# Patient Record
Sex: Female | Born: 1995 | Race: White | Hispanic: No | Marital: Single | Smoking: Never smoker
Health system: Southern US, Community
[De-identification: ages and names within clinical notes are randomized; demographics above are authoritative.]

## PROBLEM LIST (undated history)

## (undated) DIAGNOSIS — J45909 Unspecified asthma, uncomplicated: Secondary | ICD-10-CM

## (undated) HISTORY — PX: TONSILLECTOMY: SUR1361

## (undated) HISTORY — DX: Unspecified asthma, uncomplicated: J45.909

## (undated) HISTORY — PX: ADENOIDECTOMY: SUR15

---
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
## 2017-03-17 DIAGNOSIS — Z111 Encounter for screening for respiratory tuberculosis: Secondary | ICD-10-CM | POA: Diagnosis not present

## 2017-06-05 DIAGNOSIS — H66005 Acute suppurative otitis media without spontaneous rupture of ear drum, recurrent, left ear: Secondary | ICD-10-CM | POA: Diagnosis not present

## 2017-06-05 DIAGNOSIS — Z6828 Body mass index (BMI) 28.0-28.9, adult: Secondary | ICD-10-CM | POA: Diagnosis not present

## 2017-06-05 DIAGNOSIS — B9689 Other specified bacterial agents as the cause of diseases classified elsewhere: Secondary | ICD-10-CM | POA: Diagnosis not present

## 2017-06-05 DIAGNOSIS — J019 Acute sinusitis, unspecified: Secondary | ICD-10-CM | POA: Diagnosis not present

## 2017-08-08 DIAGNOSIS — J029 Acute pharyngitis, unspecified: Secondary | ICD-10-CM | POA: Diagnosis not present

## 2017-09-30 DIAGNOSIS — Z0000 Encounter for general adult medical examination without abnormal findings: Secondary | ICD-10-CM | POA: Diagnosis not present

## 2018-01-14 DIAGNOSIS — M9902 Segmental and somatic dysfunction of thoracic region: Secondary | ICD-10-CM | POA: Diagnosis not present

## 2018-01-14 DIAGNOSIS — M545 Low back pain: Secondary | ICD-10-CM | POA: Diagnosis not present

## 2018-01-14 DIAGNOSIS — M9903 Segmental and somatic dysfunction of lumbar region: Secondary | ICD-10-CM | POA: Diagnosis not present

## 2018-01-14 DIAGNOSIS — M9905 Segmental and somatic dysfunction of pelvic region: Secondary | ICD-10-CM | POA: Diagnosis not present

## 2018-01-16 DIAGNOSIS — M9902 Segmental and somatic dysfunction of thoracic region: Secondary | ICD-10-CM | POA: Diagnosis not present

## 2018-01-16 DIAGNOSIS — M9905 Segmental and somatic dysfunction of pelvic region: Secondary | ICD-10-CM | POA: Diagnosis not present

## 2018-01-16 DIAGNOSIS — M9903 Segmental and somatic dysfunction of lumbar region: Secondary | ICD-10-CM | POA: Diagnosis not present

## 2018-01-16 DIAGNOSIS — M545 Low back pain: Secondary | ICD-10-CM | POA: Diagnosis not present

## 2018-01-19 DIAGNOSIS — M9902 Segmental and somatic dysfunction of thoracic region: Secondary | ICD-10-CM | POA: Diagnosis not present

## 2018-01-19 DIAGNOSIS — M9905 Segmental and somatic dysfunction of pelvic region: Secondary | ICD-10-CM | POA: Diagnosis not present

## 2018-01-19 DIAGNOSIS — M545 Low back pain: Secondary | ICD-10-CM | POA: Diagnosis not present

## 2018-01-19 DIAGNOSIS — M9903 Segmental and somatic dysfunction of lumbar region: Secondary | ICD-10-CM | POA: Diagnosis not present

## 2018-02-06 DIAGNOSIS — N939 Abnormal uterine and vaginal bleeding, unspecified: Secondary | ICD-10-CM | POA: Diagnosis not present

## 2018-02-06 DIAGNOSIS — Z113 Encounter for screening for infections with a predominantly sexual mode of transmission: Secondary | ICD-10-CM | POA: Diagnosis not present

## 2018-05-22 DIAGNOSIS — J029 Acute pharyngitis, unspecified: Secondary | ICD-10-CM | POA: Diagnosis not present

## 2019-05-08 DIAGNOSIS — L03031 Cellulitis of right toe: Secondary | ICD-10-CM | POA: Diagnosis not present

## 2019-05-11 DIAGNOSIS — L03031 Cellulitis of right toe: Secondary | ICD-10-CM | POA: Diagnosis not present

## 2019-05-11 DIAGNOSIS — L600 Ingrowing nail: Secondary | ICD-10-CM | POA: Diagnosis not present

## 2019-06-22 DIAGNOSIS — Z1231 Encounter for screening mammogram for malignant neoplasm of breast: Secondary | ICD-10-CM | POA: Diagnosis not present

## 2019-06-22 DIAGNOSIS — Z1272 Encounter for screening for malignant neoplasm of vagina: Secondary | ICD-10-CM | POA: Diagnosis not present

## 2019-06-22 DIAGNOSIS — Z1331 Encounter for screening for depression: Secondary | ICD-10-CM | POA: Diagnosis not present

## 2019-06-22 DIAGNOSIS — Z0000 Encounter for general adult medical examination without abnormal findings: Secondary | ICD-10-CM | POA: Diagnosis not present

## 2019-06-22 DIAGNOSIS — Z124 Encounter for screening for malignant neoplasm of cervix: Secondary | ICD-10-CM | POA: Diagnosis not present

## 2020-07-02 DIAGNOSIS — J069 Acute upper respiratory infection, unspecified: Secondary | ICD-10-CM | POA: Diagnosis not present

## 2020-07-15 DIAGNOSIS — U071 COVID-19: Secondary | ICD-10-CM | POA: Diagnosis not present

## 2020-07-15 DIAGNOSIS — J069 Acute upper respiratory infection, unspecified: Secondary | ICD-10-CM | POA: Diagnosis not present

## 2020-07-15 DIAGNOSIS — R519 Headache, unspecified: Secondary | ICD-10-CM | POA: Diagnosis not present

## 2021-02-06 DIAGNOSIS — Z789 Other specified health status: Secondary | ICD-10-CM | POA: Diagnosis not present

## 2021-02-06 DIAGNOSIS — Z0000 Encounter for general adult medical examination without abnormal findings: Secondary | ICD-10-CM | POA: Diagnosis not present

## 2021-02-06 DIAGNOSIS — Z6835 Body mass index (BMI) 35.0-35.9, adult: Secondary | ICD-10-CM | POA: Diagnosis not present

## 2021-02-06 DIAGNOSIS — Z1331 Encounter for screening for depression: Secondary | ICD-10-CM | POA: Diagnosis not present

## 2021-06-13 DIAGNOSIS — J111 Influenza due to unidentified influenza virus with other respiratory manifestations: Secondary | ICD-10-CM | POA: Diagnosis not present

## 2021-07-16 DIAGNOSIS — J209 Acute bronchitis, unspecified: Secondary | ICD-10-CM | POA: Diagnosis not present

## 2021-07-16 DIAGNOSIS — R062 Wheezing: Secondary | ICD-10-CM | POA: Diagnosis not present

## 2021-07-16 DIAGNOSIS — J45909 Unspecified asthma, uncomplicated: Secondary | ICD-10-CM | POA: Diagnosis not present

## 2022-06-05 DIAGNOSIS — D225 Melanocytic nevi of trunk: Secondary | ICD-10-CM | POA: Diagnosis not present

## 2022-06-05 DIAGNOSIS — D485 Neoplasm of uncertain behavior of skin: Secondary | ICD-10-CM | POA: Diagnosis not present

## 2022-08-29 DIAGNOSIS — M25511 Pain in right shoulder: Secondary | ICD-10-CM | POA: Diagnosis not present

## 2022-09-18 DIAGNOSIS — L700 Acne vulgaris: Secondary | ICD-10-CM | POA: Diagnosis not present

## 2022-11-27 DIAGNOSIS — R0981 Nasal congestion: Secondary | ICD-10-CM | POA: Diagnosis not present

## 2022-11-27 DIAGNOSIS — J309 Allergic rhinitis, unspecified: Secondary | ICD-10-CM | POA: Diagnosis not present

## 2022-12-18 DIAGNOSIS — J309 Allergic rhinitis, unspecified: Secondary | ICD-10-CM | POA: Diagnosis not present

## 2022-12-18 DIAGNOSIS — E7800 Pure hypercholesterolemia, unspecified: Secondary | ICD-10-CM | POA: Diagnosis not present

## 2022-12-18 DIAGNOSIS — Z23 Encounter for immunization: Secondary | ICD-10-CM | POA: Diagnosis not present

## 2022-12-18 DIAGNOSIS — J4520 Mild intermittent asthma, uncomplicated: Secondary | ICD-10-CM | POA: Diagnosis not present

## 2022-12-18 DIAGNOSIS — R790 Abnormal level of blood mineral: Secondary | ICD-10-CM | POA: Diagnosis not present

## 2022-12-18 DIAGNOSIS — Z0000 Encounter for general adult medical examination without abnormal findings: Secondary | ICD-10-CM | POA: Diagnosis not present

## 2023-02-17 DIAGNOSIS — N6322 Unspecified lump in the left breast, upper inner quadrant: Secondary | ICD-10-CM | POA: Diagnosis not present

## 2023-02-17 DIAGNOSIS — Z23 Encounter for immunization: Secondary | ICD-10-CM | POA: Diagnosis not present

## 2023-02-17 DIAGNOSIS — Z01419 Encounter for gynecological examination (general) (routine) without abnormal findings: Secondary | ICD-10-CM | POA: Diagnosis not present

## 2023-02-17 DIAGNOSIS — R87610 Atypical squamous cells of undetermined significance on cytologic smear of cervix (ASC-US): Secondary | ICD-10-CM | POA: Diagnosis not present

## 2023-02-25 ENCOUNTER — Encounter: Payer: Self-pay | Admitting: Allergy

## 2023-02-25 ENCOUNTER — Ambulatory Visit (INDEPENDENT_AMBULATORY_CARE_PROVIDER_SITE_OTHER): Payer: BC Managed Care – PPO | Admitting: Allergy

## 2023-02-25 VITALS — BP 118/78 | HR 85 | Temp 98.6°F | Resp 16 | Ht 65.0 in | Wt 218.0 lb

## 2023-02-25 DIAGNOSIS — J302 Other seasonal allergic rhinitis: Secondary | ICD-10-CM

## 2023-02-25 DIAGNOSIS — J3089 Other allergic rhinitis: Secondary | ICD-10-CM | POA: Diagnosis not present

## 2023-02-25 DIAGNOSIS — H1013 Acute atopic conjunctivitis, bilateral: Secondary | ICD-10-CM

## 2023-02-25 DIAGNOSIS — J4530 Mild persistent asthma, uncomplicated: Secondary | ICD-10-CM

## 2023-02-25 MED ORDER — ALBUTEROL SULFATE HFA 108 (90 BASE) MCG/ACT IN AERS: 2.0000 | INHALATION_SPRAY | RESPIRATORY_TRACT | 1 refills | Status: DC | PRN

## 2023-02-25 MED ORDER — MONTELUKAST SODIUM 10 MG PO TABS: 10.0000 mg | ORAL_TABLET | Freq: Every day | ORAL | 5 refills | Status: DC

## 2023-02-25 MED ORDER — ALBUTEROL SULFATE (2.5 MG/3ML) 0.083% IN NEBU: 2.5000 mg | INHALATION_SOLUTION | RESPIRATORY_TRACT | 1 refills | Status: AC | PRN

## 2023-02-25 MED ORDER — RYALTRIS 665-25 MCG/ACT NA SUSP: 2.0000 | Freq: Two times a day (BID) | NASAL | 11 refills | Status: DC

## 2023-02-25 NOTE — Patient Instructions (Signed)
-   Daily controller medication(s): Singulair 10mg  daily - Prior to physical activity: albuterol 2 puffs 10-15 minutes before physical activity. - Rescue medications: albuterol 2-4 puffs or albuterol 1 vial via nebulizer every 4-6 hours as needed  - Asthma control goals:  * Full participation in all desired activities (may need albuterol before activity) * Albuterol use two time or less a week on average (not counting use with activity) * Cough interfering with sleep two time or less a month * Oral steroids no more than once a year * No hospitalizations  - Testing today showed: grasses, ragweed, weeds, trees, indoor molds, outdoor molds, dust mites, and cat - Copy of test results provided.  - Avoidance measures provided. - Continue with: Singulair (montelukast) 10mg  daily - Start taking: Xyzal (levocetirizine) 5mg  tablet once daily.  This is a long-acting antihistamine.  Ryaltris (olopatadine/mometasone) two sprays per nostril 2 times daily for nasal drainage and congestion.  This is a combination nasal spray that helps with drainage (olopatadine) and congestion (mometasone).   Point tip of bottle towards eye on same side nostril for best technique. - You can use an extra dose of the antihistamine, if needed, for breakthrough symptoms.  - Recommend nasal saline rinses 3-7 times a week to remove allergens from the nasal cavities as well as help with mucous clearance (this is especially helpful to do before the nasal sprays are given) - Consider allergy shots as a means of long-term control. - Allergy shots "re-train" and "reset" the immune system to ignore environmental allergens and decrease the resulting immune response to those allergens (sneezing, itchy watery eyes, runny nose, nasal congestion, etc).    - Allergy shots improve symptoms in 75-85% of patients.  - We can discuss more at the next appointment if the medications are not working for you.  Follow-up in 3-4 month or sooner if  needed

## 2023-02-25 NOTE — Progress Notes (Signed)
New Patient Note  RE: Veronica Black MRN: [id number] DOB: [birth year] Date of Office Visit: 02/25/2023  Primary care provider: Leane Call, PA-C  Chief Complaint: Drainage and asthma  History of present illness: Veronica Black is a 27 y.o. female presenting today for allergies. She presents today with her mother.  States she has a lot of nasal drainage and that causes her to cough. She states she has always had drainage but this year has been worse than previous.  Has been worse with the spring.  She throat clear often.  Sometimes has itchy scratchy.  Has nasal congestion, itchy eyes as well. She has taken Zyrtec.  She sometimes will take Claritin-D.  She has Singulair that she takes daily and does feel it has helped and has been on for past 1.5 months.  She has used nasal flonase that doesn't help.    She reports history of asthma. Triggers include dust as a child, [place].  Symptoms included deep cough and chest tightness.  She has not required hospitalization for asthma flare. She has had to receive nebulizer treatment in doctors office.  She has had to take prednisone for flare and states last course was in April.  She states last year she had a course of prednisone as well.  She state she has used albuterol inhaler or nebulizer more this year than ever before and states was using 3-4 times a week several months ago.  She has never used a maintenance inhaler.     Review of systems: 10pt ROS negative unless noted above in HPI  Past medical history: Past Medical History:  Diagnosis Date   Asthma     Past surgical history: Past Surgical History:  Procedure Laterality Date   ADENOIDECTOMY     51-39 years old   TONSILLECTOMY     69-53 years old    Family history:  Family History  Problem Relation Age of Onset   Food Allergy Mother    COPD Maternal Grandmother    Asthma Neg Hx    Allergic rhinitis Neg Hx    Angioedema Neg Hx    Atopy Neg Hx    Urticaria Neg Hx     Immunodeficiency Neg Hx    Eczema Neg Hx     Social history: Lives in a home without carpeting with electric heating and central cooling.  2 dogs and 1 cat in the home.  There is no concern for water damage, mildew or roaches in the home.  She is a Lawyer.  She denies a smoking history.   Medication List: Current Outpatient Medications  Medication Sig Dispense Refill   albuterol (PROVENTIL) (2.5 MG/3ML) 0.083% nebulizer solution Take 3 mLs (2.5 mg total) by nebulization every 4 (four) hours as needed for wheezing or shortness of breath. 75 mL 1   Olopatadine-Mometasone (RYALTRIS) 665-25 MCG/ACT SUSP Place 2 sprays into both nostrils in the morning and at bedtime. 29 g 11   albuterol (VENTOLIN HFA) 108 (90 Base) MCG/ACT inhaler Inhale 2 puffs into the lungs every 4 (four) hours as needed for wheezing or shortness of breath. 18 g 1   montelukast (SINGULAIR) 10 MG tablet Take 1 tablet (10 mg total) by mouth at bedtime. 30 tablet 5   No current facility-administered medications for this visit.    Known medication allergies: No Known Allergies   Physical examination: Blood pressure 118/78, pulse 85, temperature 98.6 F (37 C), temperature source Temporal, resp. rate 16, height 5\' 5"  (  1.651 m), weight 218 lb (98.9 kg), SpO2 98%.  General: Alert, interactive, in no acute distress. HEENT: PERRLA, TMs pearly gray, turbinates moderately edematous with clear discharge, post-pharynx non erythematous. Neck: Supple without lymphadenopathy. Lungs: Clear to auscultation without wheezing, rhonchi or rales. {no increased work of breathing. CV: Normal S1, S2 without murmurs. Abdomen: Nondistended, nontender. Skin: Warm and dry, without lesions or rashes. Extremities:  No clubbing, cyanosis or edema. Neuro:   Grossly intact.  Diagnositics/Labs:  Spirometry: FEV1: 3.12L 92%, FVC: 3.71L 94%, ratio consistent with nonobstructive pattern  Allergy testing:   Airborne Adult Perc - 02/25/23 1454      Time Antigen Placed 1454    Allergen Manufacturer Waynette Buttery    Location Back    Number of Test 55    1. Control-Buffer 50% Glycerol Negative    2. Control-Histamine 2+    3. Bahia 3+    4. [place] 3+    5. Johnson 2+    6. Kentucky Blue 4+    7. Meadow Fescue 4+    8. Perennial Rye 4+    9. Timothy 4+    10. Ragweed Mix 2+    11. Cocklebur Negative    12. Plantain,  English Negative    13. Baccharis 2+    14. Dog Fennel Negative    15. Russian Thistle Negative    16. Lamb's Quarters 2+    17. Sheep Sorrell Negative    18. Rough Pigweed Negative    19. Marsh Elder, Rough Negative    20. Mugwort, Common 2+    21. Box, Elder 2+    22. Cedar, red Negative    23. Sweet Gum Negative    24. Pecan Pollen 2+    25. Pine Mix 2+    26. Walnut, Black Pollen 2+    27. Red Mulberry 2+    28. Ash Mix Negative    29. Birch Mix Negative    30. Beech American Negative    31. Cottonwood, Guinea-Bissau Negative    32. Hickory, White 2+    33. Maple Mix Negative    34. Oak, Guinea-[place]issau Mix Negative    35. Sycamore Eastern Negative    36. Alternaria Alternata 2+    37. Cladosporium Herbarum Negative    38. Aspergillus Mix 2+    39. Penicillium Mix Negative    40. Bipolaris Sorokiniana (Helminthosporium) 2+    41. Drechslera Spicifera (Curvularia) Negative    42. Mucor Plumbeus Negative    43. Fusarium Moniliforme Negative    44. Aureobasidium Pullulans (pullulara) Negative    45. Rhizopus Oryzae Negative    46. Botrytis Cinera Negative    47. Epicoccum Nigrum 2+    48. Phoma Betae Negative    49. Dust Mite Mix 2+    50. Cat Hair 10,000 BAU/ml 2+    51.  Dog Epithelia Negative    52. Mixed Feathers Negative    53. Horse Epithelia Negative    54. Cockroach, German Negative    55. Tobacco Leaf Negative             Allergy testing results were read and interpreted by provider, documented by clinical staff.   Assessment and plan: Mild persistent asthma  - Daily controller medication(s):  Singulair 10mg  daily - Prior to physical activity: albuterol 2 puffs 10-15 minutes before physical activity. - Rescue medications: albuterol 2-4 puffs or albuterol 1 vial via nebulizer every 4-6 hours as needed  - Asthma control goals:  *  Full participation in all desired activities (may need albuterol before activity) * Albuterol use two time or less a week on average (not counting use with activity) * Cough interfering with sleep two time or less a month * Oral steroids no more than once a year * No hospitalizations  Allergic rhinitis with conjunctivitis - Testing today showed: grasses, ragweed, weeds, trees, indoor molds, outdoor molds, dust mites, and cat - Copy of test results provided.  - Avoidance measures provided. - Continue with: Singulair (montelukast) 10mg  daily - Start taking: Xyzal (levocetirizine) 5mg  tablet once daily.  This is a long-acting antihistamine.  Ryaltris (olopatadine/mometasone) two sprays per nostril 2 times daily for nasal drainage and congestion.  This is a combination nasal spray that helps with drainage (olopatadine) and congestion (mometasone).   Point tip of bottle towards eye on same side nostril for best technique. - You can use an extra dose of the antihistamine, if needed, for breakthrough symptoms.  - Recommend nasal saline rinses 3-7 times a week to remove allergens from the nasal cavities as well as help with mucous clearance (this is especially helpful to do before the nasal sprays are given) - Consider allergy shots as a means of long-term control. - Allergy shots "re-train" and "reset" the immune system to ignore environmental allergens and decrease the resulting immune response to those allergens (sneezing, itchy watery eyes, runny nose, nasal congestion, etc).    - Allergy shots improve symptoms in 75-85% of patients.  - We can discuss more at the next appointment if the medications are not working for you.  Follow-up in 3-4 month or sooner if  needed    I appreciate the opportunity to take part in Veronica Black's care. Please do not hesitate to contact me with questions.  Sincerely,   Margo Aye, MD Allergy/Immunology [facility]

## 2023-02-26 ENCOUNTER — Telehealth: Payer: Self-pay | Admitting: Allergy

## 2023-02-26 NOTE — Telephone Encounter (Signed)
Patient states Ryaltris is not being covered by INS and would like an alternative sent in if possible.

## 2023-02-27 NOTE — Telephone Encounter (Signed)
Please help with prior authorization

## 2023-02-28 ENCOUNTER — Telehealth: Payer: Self-pay

## 2023-02-28 ENCOUNTER — Other Ambulatory Visit (HOSPITAL_COMMUNITY): Payer: Self-pay

## 2023-02-28 NOTE — Telephone Encounter (Signed)
Pharmacy Patient Advocate Encounter   Received notification from Pt Calls Messages that prior authorization for Ryaltris 665-25MCG/ACT suspension is required/requested.   Insurance verification completed.   The patient is insured through [facility] .   Per test claim: PA required; PA submitted to [facility] via CoverMyMeds Key/confirmation #/EOC [id number] Status is pending

## 2023-02-28 NOTE — Telephone Encounter (Signed)
PA request has been Submitted. New Encounter created for follow up.

## 2023-03-03 DIAGNOSIS — N6002 Solitary cyst of left breast: Secondary | ICD-10-CM | POA: Diagnosis not present

## 2023-03-03 DIAGNOSIS — N6012 Diffuse cystic mastopathy of left breast: Secondary | ICD-10-CM | POA: Diagnosis not present

## 2023-03-03 DIAGNOSIS — N6322 Unspecified lump in the left breast, upper inner quadrant: Secondary | ICD-10-CM | POA: Diagnosis not present

## 2023-03-03 DIAGNOSIS — N6320 Unspecified lump in the left breast, unspecified quadrant: Secondary | ICD-10-CM | POA: Diagnosis not present

## 2023-03-24 ENCOUNTER — Other Ambulatory Visit (HOSPITAL_COMMUNITY): Payer: Self-pay

## 2023-03-24 NOTE — Telephone Encounter (Signed)
Pharmacy Patient Advocate Encounter  Received notification from [facility] that Prior Authorization for Ryaltris 665-25MCG/ACT suspension has been CANCELLED due to PA not able to be processed electronically.    PA #/Case ID/Reference #: [id number]

## 2023-03-28 DIAGNOSIS — R220 Localized swelling, mass and lump, head: Secondary | ICD-10-CM | POA: Diagnosis not present

## 2023-04-01 DIAGNOSIS — R87810 Cervical high risk human papillomavirus (HPV) DNA test positive: Secondary | ICD-10-CM | POA: Diagnosis not present

## 2023-04-01 DIAGNOSIS — R87610 Atypical squamous cells of undetermined significance on cytologic smear of cervix (ASC-US): Secondary | ICD-10-CM | POA: Diagnosis not present

## 2023-04-01 DIAGNOSIS — N72 Inflammatory disease of cervix uteri: Secondary | ICD-10-CM | POA: Diagnosis not present

## 2023-05-27 ENCOUNTER — Ambulatory Visit (INDEPENDENT_AMBULATORY_CARE_PROVIDER_SITE_OTHER): Payer: BC Managed Care – PPO | Admitting: Allergy

## 2023-05-27 ENCOUNTER — Encounter: Payer: Self-pay | Admitting: Allergy

## 2023-05-27 VITALS — BP 102/60 | HR 94 | Resp 18

## 2023-05-27 DIAGNOSIS — J4530 Mild persistent asthma, uncomplicated: Secondary | ICD-10-CM

## 2023-05-27 DIAGNOSIS — J3089 Other allergic rhinitis: Secondary | ICD-10-CM

## 2023-05-27 DIAGNOSIS — J302 Other seasonal allergic rhinitis: Secondary | ICD-10-CM | POA: Diagnosis not present

## 2023-05-27 MED ORDER — MONTELUKAST SODIUM 10 MG PO TABS: 10.0000 mg | ORAL_TABLET | Freq: Every day | ORAL | 11 refills | Status: DC

## 2023-05-27 MED ORDER — AZELASTINE-FLUTICASONE 137-50 MCG/ACT NA SUSP: NASAL | 11 refills | Status: DC

## 2023-05-27 NOTE — Progress Notes (Signed)
Follow-up Note  RE: Veronica Black MRN: [id number] DOB: 1995/09/06 Date of Office Visit: 05/27/2023   History of present illness: Veronica Black is a 28 y.o. female presenting today for follow-up of asthma and allergic rhinitis.  She was last seen in the office on 02/25/23 by myself.   Discussed the use of AI scribe software for clinical note transcription with the patient, who gave verbal consent to proceed.  She has been on a regimen of Singulair (montelukast) and Xyzal, which she reports has significantly improved her symptoms. She takes two Xyzal in the morning and Singulair at night due to its sedative effect. She recently returned from a trip to [place], during which she experienced some stuffiness, possibly due to the change in weather and air pressure from flying.  The patient also reports using a sample of a nasal spray Ryaltris, which she found helpful for controlling runny and stuffy nose symptoms. However, she was unable to obtain the spray through her insurance due to coverage. She also used saline sprays for cleansing.  In terms of respiratory symptoms, the patient has only needed to use her rescue inhaler once since her last visit in July. She also used her nebulizer during this episode, which occurred while she was helping her family member in a chicken coop. She has not needed to take prednisone or receive a steroid shot since July. She has not had any need to visit an urgent care, the emergency department, be hospitalized, or have surgery since her last visit.  Review of systems: 10pt ROS negative unless noted above in HPI   All other systems negative unless noted above in HPI  Past medical/social/surgical/family history have been reviewed and are unchanged unless specifically indicated below.  No changes  Medication List: Current Outpatient Medications  Medication Sig Dispense Refill   Azelastine-Fluticasone (DYMISTA) 137-50 MCG/ACT SUSP Use 1-2 sprays in each nostril  1-2 times daily 23 g 11   albuterol (PROVENTIL) (2.5 MG/3ML) 0.083% nebulizer solution Take 3 mLs (2.5 mg total) by nebulization every 4 (four) hours as needed for wheezing or shortness of breath. 75 mL 1   albuterol (VENTOLIN HFA) 108 (90 Base) MCG/ACT inhaler Inhale 2 puffs into the lungs every 4 (four) hours as needed for wheezing or shortness of breath. 18 g 1   montelukast (SINGULAIR) 10 MG tablet Take 1 tablet (10 mg total) by mouth at bedtime. 30 tablet 11   Olopatadine-Mometasone (RYALTRIS) 665-25 MCG/ACT SUSP Place 2 sprays into both nostrils in the morning and at bedtime. 29 g 11   No current facility-administered medications for this visit.     Known medication allergies: No Known Allergies   Physical examination: Blood pressure 102/60, pulse 94, resp. rate 18, SpO2 98%.  General: Alert, interactive, in no acute distress. HEENT: PERRLA, TMs pearly gray, turbinates minimally edematous without discharge, post-pharynx non erythematous. Neck: Supple without lymphadenopathy. Lungs: Clear to auscultation without wheezing, rhonchi or rales. {no increased work of breathing. CV: Normal S1, S2 without murmurs. Abdomen: Nondistended, nontender. Skin: Warm and dry, without lesions or rashes. Extremities:  No clubbing, cyanosis or edema. Neuro:   Grossly intact.  Diagnositics/Labs: None today   Assessment and plan: Asthma, mild persistent - Daily controller medication(s): Singulair 10mg  daily - Prior to physical activity: albuterol 2 puffs 10-15 minutes before physical activity. - Rescue medications: albuterol 2-4 puffs or albuterol 1 vial via nebulizer every 4-6 hours as needed  - Asthma control goals:  * Full participation in all desired activities (  may need albuterol before activity) * Albuterol use two time or less a week on average (not counting use with activity) * Cough interfering with sleep two time or less a month * Oral steroids no more than once a year * No  hospitalizations  Allergic rhinitis - Continue avoidance measures for grasses, ragweed, weeds, trees, indoor molds, outdoor molds, dust mites, and cat - Continue with:  Singulair (montelukast) 10mg  daily Xyzal (levocetirizine) 5mg  tablet once daily.  This is a long-acting antihistamine. s Ryaltris not covered.  Will try for Dymista combination spray use 1 spray each nostril twice a day as needed for runny or stuffy nose.  If Dymista not covered then use a nasal steroid spray like Nasacort, Rhinocort, Nasonex, Flonase 2 sprays each nostril daily for 1-2 weeks at a time before stopping once nasal congestion improves for maximum benefit.  Use Astepro 2 sprays each nostril twice a day as needed for runny nose.   - You can use an extra dose of the antihistamine, if needed, for breakthrough symptoms.  - Recommend nasal saline rinses 3-7 times a week to remove allergens from the nasal cavities as well as help with mucous clearance (this is especially helpful to do before the nasal sprays are given) - Consider allergy shots as a means of long-term control if medication management is not effective enough. - Allergy shots "re-train" and "reset" he immune system to ignore environmental allergens and decrease the resulting immune response to those allergens (sneezing, itchy watery eyes, runny nose, nasal congestion, etc).     Follow-up in 6 month or sooner if needed  I appreciate the opportunity to take part in Veronica Black's care. Please do not hesitate to contact me with questions.  Sincerely,   Margo Aye, MD Allergy/Immunology [facility]

## 2023-05-27 NOTE — Patient Instructions (Addendum)
-   Daily controller medication(s): Singulair 10mg  daily - Prior to physical activity: albuterol 2 puffs 10-15 minutes before physical activity. - Rescue medications: albuterol 2-4 puffs or albuterol 1 vial via nebulizer every 4-6 hours as needed  - Asthma control goals:  * Full participation in all desired activities (may need albuterol before activity) * Albuterol use two time or less a week on average (not counting use with activity) * Cough interfering with sleep two time or less a month * Oral steroids no more than once a year * No hospitalizations  - Continue avoidance measures for grasses, ragweed, weeds, trees, indoor molds, outdoor molds, dust mites, and cat - Continue with:  Singulair (montelukast) 10mg  daily Xyzal (levocetirizine) 5mg  tablet once daily.  This is a long-acting antihistamine. s Ryaltris not covered.  Will try for Dymista combination spray use 1 spray each nostril twice a day as needed for runny or stuffy nose.  If Dymista not covered then use a nasal steroid spray like Nasacort, Rhinocort, Nasonex, Flonase 2 sprays each nostril daily for 1-2 weeks at a time before stopping once nasal congestion improves for maximum benefit.  Use Astepro 2 sprays each nostril twice a day as needed for runny nose.   - You can use an extra dose of the antihistamine, if needed, for breakthrough symptoms.  - Recommend nasal saline rinses 3-7 times a week to remove allergens from the nasal cavities as well as help with mucous clearance (this is especially helpful to do before the nasal sprays are given) - Consider allergy shots as a means of long-term control if medication management is not effective enough. - Allergy shots "re-train" and "reset" he immune system to ignore environmental allergens and decrease the resulting immune response to those allergens (sneezing, itchy watery eyes, runny nose, nasal congestion, etc).     Follow-up in 6 month or sooner if needed

## 2023-06-04 DIAGNOSIS — S61211A Laceration without foreign body of left index finger without damage to nail, initial encounter: Secondary | ICD-10-CM | POA: Diagnosis not present

## 2023-06-04 DIAGNOSIS — L700 Acne vulgaris: Secondary | ICD-10-CM | POA: Diagnosis not present

## 2023-06-11 ENCOUNTER — Telehealth: Payer: Self-pay

## 2023-06-11 ENCOUNTER — Other Ambulatory Visit (HOSPITAL_COMMUNITY): Payer: Self-pay

## 2023-06-11 NOTE — Telephone Encounter (Signed)
Pharmacy Patient Advocate Encounter   Received notification from CoverMyMeds that prior authorization for Dymista 137-50MCG/ACT suspension is required/requested.   Insurance verification completed.   The patient is insured through [facility] .   Per test claim: PA required; PA submitted to above mentioned insurance via CoverMyMeds Key/confirmation #/EOC [id number] Status is pending

## 2023-06-16 NOTE — Telephone Encounter (Signed)
Pharmacy Patient Advocate Encounter  Received notification from [facility] that Prior Authorization for Dymista has been DENIED.  Full denial letter will be uploaded to the media tab. See denial reason below.   PA #/Case ID/Reference #: Pharmacy benefits does not cover this medication

## 2023-06-17 NOTE — Telephone Encounter (Signed)
Pt informed of denial- she will use otc option per Dr. Randell Patient OV note.

## 2023-06-18 DIAGNOSIS — N760 Acute vaginitis: Secondary | ICD-10-CM | POA: Diagnosis not present

## 2023-06-18 DIAGNOSIS — B9689 Other specified bacterial agents as the cause of diseases classified elsewhere: Secondary | ICD-10-CM | POA: Diagnosis not present

## 2023-07-03 DIAGNOSIS — R1110 Vomiting, unspecified: Secondary | ICD-10-CM | POA: Diagnosis not present

## 2023-07-04 DIAGNOSIS — L700 Acne vulgaris: Secondary | ICD-10-CM | POA: Diagnosis not present

## 2023-07-04 DIAGNOSIS — Z79899 Other long term (current) drug therapy: Secondary | ICD-10-CM | POA: Diagnosis not present

## 2023-07-04 DIAGNOSIS — E785 Hyperlipidemia, unspecified: Secondary | ICD-10-CM | POA: Diagnosis not present

## 2023-07-07 DIAGNOSIS — L700 Acne vulgaris: Secondary | ICD-10-CM | POA: Diagnosis not present

## 2023-07-17 DIAGNOSIS — R1110 Vomiting, unspecified: Secondary | ICD-10-CM | POA: Diagnosis not present

## 2023-11-25 ENCOUNTER — Ambulatory Visit (INDEPENDENT_AMBULATORY_CARE_PROVIDER_SITE_OTHER): Payer: BC Managed Care – PPO | Admitting: Allergy

## 2023-11-25 ENCOUNTER — Encounter: Payer: Self-pay | Admitting: Allergy

## 2023-11-25 VITALS — BP 112/70 | HR 70 | Temp 98.2°F | Resp 12

## 2023-11-25 DIAGNOSIS — J3089 Other allergic rhinitis: Secondary | ICD-10-CM

## 2023-11-25 DIAGNOSIS — J302 Other seasonal allergic rhinitis: Secondary | ICD-10-CM

## 2023-11-25 DIAGNOSIS — J4530 Mild persistent asthma, uncomplicated: Secondary | ICD-10-CM | POA: Diagnosis not present

## 2023-11-25 MED ORDER — ALBUTEROL SULFATE HFA 108 (90 BASE) MCG/ACT IN AERS: 2.0000 | INHALATION_SPRAY | RESPIRATORY_TRACT | 1 refills | Status: AC | PRN

## 2023-11-25 MED ORDER — MONTELUKAST SODIUM 10 MG PO TABS: 10.0000 mg | ORAL_TABLET | Freq: Every day | ORAL | 5 refills | Status: AC

## 2023-11-25 MED ORDER — AZELASTINE-FLUTICASONE 137-50 MCG/ACT NA SUSP: NASAL | 5 refills | Status: AC

## 2023-11-25 NOTE — Patient Instructions (Addendum)
-   Daily controller medication(s): Singulair  10mg  daily - Prior to physical activity: albuterol  2 puffs 10-15 minutes before physical activity. - Rescue medications: albuterol  2-4 puffs or albuterol  1 vial via nebulizer every 4-6 hours as needed  - Asthma control goals:  * Full participation in all desired activities (may need albuterol  before activity) * Albuterol  use two time or less a week on average (not counting use with activity) * Cough interfering with sleep two time or less a month * Oral steroids no more than once a year * No hospitalizations  - Continue avoidance measures for grasses, ragweed, weeds, trees, indoor molds, outdoor molds, dust mites, and cat - Continue with:  Singulair  (montelukast ) 10mg  daily Xyzal (levocetirizine) 5mg  tablet once daily.  May take additional dose if needed.  Dymista  combination spray use 1 spray each nostril twice a day as needed for runny or stuffy nose.   Would use know at this time with left side nasal obstruction.  - Recommend nasal saline rinses 3-7 times a week to remove allergens from the nasal cavities as well as help with mucous clearance (this is especially helpful to do before the nasal sprays are given) - Consider allergy  shots as a means of long-term control if medication management is not effective enough. - Allergy  shots "re-train" and "reset" he immune system to ignore environmental allergens and decrease the resulting immune response to those allergens (sneezing, itchy watery eyes, runny nose, nasal congestion, etc).     Follow-up in 6 month or sooner if needed

## 2023-11-25 NOTE — Progress Notes (Signed)
 Follow-up Note  RE: Veronica Black MRN: [id number] DOB: 10-01-1995 Date of Office Visit: 11/25/2023   History of present illness: Veronica Black is a 28 y.o. female presenting today for follow-up of asthma and allergic rhinitis. She was last seen in the office on 05/27/23 by myself.  Discussed the use of AI scribe software for clinical note transcription with the patient, who gave verbal consent to proceed.  No major health changes since the last visit. An upper endoscopy was performed without complications, and there were no issues with anesthesia or breathing.  Asthma is well-controlled with no use of rescue inhaler in the past six months. She continues to take Singulair , which effectively manages both asthma and allergy  symptoms. Despite high pollen levels, there has been no increase in allergy  symptoms. She also takes Xyzal and has had to take double dose at times. She has not needed to use her nasal spray, Dymista , recently. Occasionally uses saline rinses when necessary, but not regularly.  Review of systems: 10pt ROS negative unless noted above in HPI  Past medical/social/surgical/family history have been reviewed and are unchanged unless specifically indicated below.  No changes  Medication List: Current Outpatient Medications  Medication Sig Dispense Refill   albuterol  (PROVENTIL ) (2.5 MG/3ML) 0.083% nebulizer solution Take 3 mLs (2.5 mg total) by nebulization every 4 (four) hours as needed for wheezing or shortness of breath. 75 mL 1   cyclobenzaprine (FLEXERIL) 5 MG tablet Take 5 mg by mouth every 8 (eight) hours as needed.     albuterol  (VENTOLIN  HFA) 108 (90 Base) MCG/ACT inhaler Inhale 2 puffs into the lungs every 4 (four) hours as needed for wheezing or shortness of breath. 18 g 1   Azelastine -Fluticasone  (DYMISTA ) 137-50 MCG/ACT SUSP Use 1-2 sprays in each nostril 1-2 times daily 23 g 5   montelukast  (SINGULAIR ) 10 MG tablet Take 1 tablet (10 mg total) by mouth at bedtime.  30 tablet 5   No current facility-administered medications for this visit.     Known medication allergies: No Known Allergies   Physical examination: Blood pressure 112/70, pulse 70, temperature 98.2 F (36.8 C), temperature source Temporal, resp. rate 12, SpO2 98%.  General: Alert, interactive, in no acute distress. HEENT: PERRLA, TMs pearly gray, turbinates markedly edematous with clear discharge of left nostril, post-pharynx non erythematous. Neck: Supple without lymphadenopathy. Lungs: Clear to auscultation without wheezing, rhonchi or rales. {no increased work of breathing. CV: Normal S1, S2 without murmurs. Abdomen: Nondistended, nontender. Skin: Warm and dry, without lesions or rashes. Extremities:  No clubbing, cyanosis or edema. Neuro:   Grossly intact.  Diagnositics/Labs: None today  Assessment and plan: Asthma, mild persistent  - Daily controller medication(s): Singulair  10mg  daily - Prior to physical activity: albuterol  2 puffs 10-15 minutes before physical activity. - Rescue medications: albuterol  2-4 puffs or albuterol  1 vial via nebulizer every 4-6 hours as needed  - Asthma control goals:  * Full participation in all desired activities (may need albuterol  before activity) * Albuterol  use two time or less a week on average (not counting use with activity) * Cough interfering with sleep two time or less a month * Oral steroids no more than once a year * No hospitalizations  Allergic rhinitis - Continue avoidance measures for grasses, ragweed, weeds, trees, indoor molds, outdoor molds, dust mites, and cat - Continue with:  Singulair  (montelukast ) 10mg  daily Xyzal (levocetirizine) 5mg  tablet once daily.  May take additional dose if needed.  Dymista  combination spray use 1 spray each  nostril twice a day as needed for runny or stuffy nose.   Would use know at this time with left side nasal obstruction.  - Recommend nasal saline rinses 3-7 times a week to remove  allergens from the nasal cavities as well as help with mucous clearance (this is especially helpful to do before the nasal sprays are given) - Consider allergy  shots as a means of long-term control if medication management is not effective enough. - Allergy  shots "re-train" and "reset" he immune system to ignore environmental allergens and decrease the resulting immune response to those allergens (sneezing, itchy watery eyes, runny nose, nasal congestion, etc).     Follow-up in 6 month or sooner if needed  I appreciate the opportunity to take part in Veronica Black's care. Please do not hesitate to contact me with questions.  Sincerely,   Catha Clink, MD Allergy /Immunology [facility]

## 2024-05-25 ENCOUNTER — Ambulatory Visit: Admitting: Allergy
